# Patient Record
Sex: Male | Born: 1980 | Race: White | Hispanic: No | Marital: Married | Smoking: Never smoker
Health system: Southern US, Community
[De-identification: ages and names within clinical notes are randomized; demographics above are authoritative.]

## PROBLEM LIST (undated history)

## (undated) HISTORY — PX: BACK SURGERY: SHX140

## (undated) HISTORY — PX: ROUX-EN-Y GASTRIC BYPASS: SHX1104

---
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
## 2005-04-08 ENCOUNTER — Emergency Department (HOSPITAL_COMMUNITY): Admission: EM | Admit: 2005-04-08 | Discharge: 2005-04-08 | Payer: Self-pay | Admitting: *Deleted

## 2008-03-12 ENCOUNTER — Ambulatory Visit: Payer: Self-pay | Admitting: Family Medicine

## 2008-03-19 ENCOUNTER — Ambulatory Visit: Payer: Self-pay | Admitting: Family Medicine

## 2009-05-20 ENCOUNTER — Ambulatory Visit: Payer: Self-pay | Admitting: Family Medicine

## 2009-05-20 DIAGNOSIS — M549 Dorsalgia, unspecified: Secondary | ICD-10-CM | POA: Insufficient documentation

## 2009-05-20 DIAGNOSIS — Z872 Personal history of diseases of the skin and subcutaneous tissue: Secondary | ICD-10-CM | POA: Insufficient documentation

## 2009-05-20 DIAGNOSIS — F329 Major depressive disorder, single episode, unspecified: Secondary | ICD-10-CM

## 2009-07-30 ENCOUNTER — Ambulatory Visit: Payer: Self-pay | Admitting: Family Medicine

## 2009-09-16 ENCOUNTER — Ambulatory Visit: Payer: Self-pay | Admitting: Emergency Medicine

## 2009-09-16 DIAGNOSIS — M79609 Pain in unspecified limb: Secondary | ICD-10-CM

## 2009-09-21 ENCOUNTER — Telehealth (INDEPENDENT_AMBULATORY_CARE_PROVIDER_SITE_OTHER): Payer: Self-pay | Admitting: *Deleted

## 2010-03-08 NOTE — Assessment & Plan Note (Signed)
Summary: L ankle pain x today rm 4   Vital Signs:  Patient Profile:   30 Years Old Male CC:      L ankle pain x today Height:     68 inches Weight:      238 pounds O2 Sat:      100 % O2 treatment:    Room Air Temp:     97.8 degrees F oral Pulse rate:   88 / minute Pulse rhythm:   regular Resp:     16 per minute BP sitting:   115 / 83  (left arm) Cuff size:   regular  Vitals Entered By: Areta Haber CMA (September 16, 2009 6:52 PM)                  Current Allergies: ! REGLAN     History of Present Illness Chief Complaint: L ankle pain x today History of Present Illness: Left ankle pain today.  Wasn't doing anything, just standing around and it started hurting.  He has a remote history of a tibial fracture (the hardware was all removed per him).  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  Sharp pain on the back of his heel.  Able to walk.  No OTC's help and he cannot take NSAIDs due to stomach surgery.    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)   Current Meds BUPROPION HCL 100 MG TABS (BUPROPION HCL) Take 1 tablet by mouth three times a day CLARITIN 10 MG TABS (LORATADINE) as directed as needed MULTIVITAMINS  TABS (MULTIPLE VITAMIN) Take one tabelt by mouth once  aydya PRILOSEC 20 MG CPDR (OMEPRAZOLE) Take one tablet by mouth twice a day VITAMIN D3 1000 UNIT CAPS (CHOLECALCIFEROL) Take one tablet by mouth twice a day FERROUS SULFATE 325 (65 FE) MG TABS (FERROUS SULFATE) Take one tablet by mouth once a day TUMS 500 MG CHEW (CALCIUM CARBONATE ANTACID) Take one tablet by mouth three times a day TRAMADOL HCL 50 MG TABS (TRAMADOL HCL) Take 1 tablet by mouth three times a day as needed for back pain. ACTIGALL 300 MG CAPS (URSODIOL) Take 1 tab by mouth two times a day CARISOPRODOL 350 MG TABS (CARISOPRODOL) 1 tab by mouth at bedtime as needed  back pain VICODIN 5-500 MG TABS (HYDROCODONE-ACETAMINOPHEN) 1-2 tabs by mouth at bedtime as needed back pain VOLTAREN 1 % GEL (DICLOFENAC SODIUM) apply to affected area twice a day  REVIEW OF SYSTEMS Constitutional Symptoms      Denies fever, chills, night sweats, weight loss, weight gain, and fatigue.  Eyes       Denies change in vision, eye pain, eye discharge, glasses, contact lenses, and eye surgery. Ear/Nose/Throat/Mouth       Denies hearing loss/aids, change in hearing, ear pain, ear discharge, dizziness, frequent runny nose, frequent nose bleeds, sinus problems, sore throat, hoarseness, and tooth pain or bleeding.  Respiratory       Denies dry cough, productive cough, wheezing, shortness of breath, asthma, bronchitis, and emphysema/COPD.  Cardiovascular       Denies murmurs, chest pain, and tires easily with exhertion.    Gastrointestinal       Denies stomach pain, nausea/vomiting, diarrhea, constipation, blood in bowel movements, and indigestion. Genitourniary       Denies painful urination, kidney stones, and loss of urinary control. Neurological       Denies paralysis, seizures, and fainting/blackouts. Musculoskeletal  Complains of muscle pain, joint pain, joint stiffness, and decreased range of motion.      Denies redness, swelling, muscle weakness, and gout.      Comments: L ankle pain x today Skin       Denies bruising, unusual mles/lumps or sores, and hair/skin or nail changes.  Psych       Denies mood changes, temper/anger issues, anxiety/stress, speech problems, depression, and sleep problems. Other Comments: Pt states he has not done anything out of the normal for L ankle to hurt.   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  Never Smoked Alcohol use-yes, 3-4 times weekly Drug use-no Regular exercise-no 1 caffeinated drink per day.   Risk Factors: Alcohol Use: 0 (05/20/2009) Exercise: no (05/20/2009)  Risk Factors: Smoking Status: never (05/20/2009)  Past Medical History: Menier's Disease Chronic Back Pain.  Acid Reflux Depression Physical Exam General appearance: well developed, well nourished, no acute distress Extremities: Left foot/ankle: TTP over Achilles esp retrocancaneal bursa, Thompson test normal, no defect in Achilles felt, no other tenderness, mild swelling Neurological: grossly intact and non-focal Skin: no obvious rashes or lesions Assessment New Problems: HEEL PAIN, LEFT (ICD-729.5)  Retrocalcaneal bursitis Given a boot and he feels better when walking out the door  Patient Education: Patient and/or caregiver instructed in the following: rest.  Plan New Medications/Changes: VOLTAREN 1 % GEL (DICLOFENAC SODIUM) apply to affected area twice a day  #5 x 100g x 0, 09/16/2009, Hoyt Koch MD  New Orders: Est. Patient Level III [99213] Ambulatory Surgical Boot ea [L3260] Planning Comments:   Wear boot for the next week to allow your Achilles to rest Use the Voltaren gel as instructed Ice twice a day If not getting better, to follow up with your PCP or call Sports medicine downstairs to set up an appointment If not improving, may either need an Xray or Ultrasound or other workup   The patient and/or caregiver has been counseled thoroughly with regard to medications prescribed including dosage, schedule, interactions, rationale for use, and possible side effects and they verbalize understanding.  Diagnoses and expected course of recovery discussed and will return if not improved as expected or if the condition worsens.  Patient and/or caregiver verbalized understanding.  Prescriptions: VOLTAREN 1 % GEL (DICLOFENAC SODIUM) apply to affected area twice a day  #5 x 100g x 0   Entered and Authorized by:   Hoyt Koch MD   Signed by:   Hoyt Koch MD on 09/16/2009   Method used:   Printed then faxed to ...       [facility] [phone]* (retail)       [address] [place], [place]  [address]       Ph: [phone]       Fax: [phone]   RxID:   [id number]   Orders Added: 1)  Est. Patient Level III [24401] 2)  Ambulatory Surgical Boot ea [L3260]

## 2010-03-08 NOTE — Assessment & Plan Note (Signed)
Summary: LBP   Vital Signs:  Patient profile:   30 year old male Height:      68 inches Weight:      248 pounds BMI:     37.84 O2 Sat:      99 % on Room air Pulse rate:   84 / minute BP sitting:   124 / 78  (left arm) Cuff size:   large  Vitals Entered By: Payton Spark CMA (July 30, 2009 11:32 AM)  O2 Flow:  Room air CC: F/U back pain. Tramadol not working very well. , Back Pain   Primary Care Provider:  Nani Gasser, MD.   CC:  F/U back pain. Tramadol not working very well.  and Back Pain.  History of Present Illness:       This is a 30 year old man who presents with Back Pain.  Has back pain mostly on work days later in the afternoon.  Has pain just to the L of the lower back.  Denies radiation of pain into buttock or down the legs.  He gets some spasms.  He did PT but his insurance didn't cover it.  Chiropractor did not help a year ago.  Did accupuncture and E-Stim which helped temporarily.  Tramadol not helping.  Cannot take NSAIDs.  Had 3 LESIs. They did not help.  The patient denies weakness and urinary retention.  The pain is located in the left low back.  The pain is made worse by standing or walking and activity.  The pain is made better by inactivity and heat.    Current Medications (verified): 1)  Bupropion Hcl 100 Mg Tabs (Bupropion Hcl) .... Take 1 Tablet By Mouth Three Times A Day 2)  Claritin 10 Mg Tabs (Loratadine) .... As Directed As Needed 3)  Multivitamins  Tabs (Multiple Vitamin) .... Take One Tabelt By Mouth Once  Aydya 4)  Prilosec 20 Mg Cpdr (Omeprazole) .... Take One Tablet By Mouth Twice A Day 5)  Vitamin D3 1000 Unit Caps (Cholecalciferol) .... Take One Tablet By Mouth Twice A Day 6)  Ferrous Sulfate 325 (65 Fe) Mg Tabs (Ferrous Sulfate) .... Take One Tablet By Mouth Once A Day 7)  Tums 500 Mg Chew (Calcium Carbonate Antacid) .... Take One Tablet By Mouth Three Times A Day 8)  Tramadol Hcl 50 Mg Tabs (Tramadol Hcl) .... Take 1 Tablet By Mouth  Three Times A Day As Needed For Back Pain. 9)  Actigall 300 Mg Caps (Ursodiol) .... Take 1 Tab By Mouth Two Times A Day  Allergies (verified): 1)  ! Reglan  Past History:  Past Medical History: Reviewed history from 05/20/2009 and no changes required. Menier's Disease Chronic Back Pain.   Past Surgical History: Reviewed history from 05/20/2009 and no changes required. 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  Never Smoked Alcohol use-yes, 3-4 times weekly Drug use-no Regular exercise-no 1 caffeinated drink per day.   Review of Systems      See HPI  Physical Exam  General:  alert, well-developed, well-nourished, and well-hydrated.  obese (down from 330!) Lungs:  normal respiratory effort, no intercostal retractions, and no accessory muscle use.   Heart:  normal rate, regular rhythm, and no murmur.   Msk:  point tender over the L transverse process of  L5 with muscle fullness.  full active L spine ROM.  Neg seated straight leg raise Neurologic:  gait normal.     Impression & Recommendations:  Problem # 1:  BACK PAIN, CHRONIC (ICD-724.5) Hx of Lumbar bulding discs after lifting incident in 06 and morbid obesity, losing weight 6 mos after gastric bypass surgery.  Has tried accupuncture, chiropractor, flexeril and has to avoid NSAIDs after bypass.  Did not respond to Tramadol.  Symptoms / exam today c/w MSK LBP with lack of regular exercise.  Will do home PT - h/o given + add a walking program.  Use Thermacare heat wraps during the day and use Soma with Vicodin at night only if needed for pain.  F/U in 4 mos.  Keep up the wt loss --> this should also alleviate some of his back pain. His updated medication list for this problem includes:    Tramadol Hcl 50 Mg Tabs (Tramadol  hcl) .Marland Kitchen... Take 1 tablet by mouth three times a day as needed for back pain.    Carisoprodol 350 Mg Tabs (Carisoprodol) .Marland Kitchen... 1 tab by mouth at bedtime as needed back pain    Vicodin 5-500 Mg Tabs (Hydrocodone-acetaminophen) .Marland Kitchen... 1-2 tabs by mouth at bedtime as needed back pain  Complete Medication List: 1)  Bupropion Hcl 100 Mg Tabs (Bupropion hcl) .... Take 1 tablet by mouth three times a day 2)  Claritin 10 Mg Tabs (Loratadine) .... As directed as needed 3)  Multivitamins Tabs (Multiple vitamin) .... Take one tabelt by mouth once  aydya 4)  Prilosec 20 Mg Cpdr (Omeprazole) .... Take one tablet by mouth twice a day 5)  Vitamin D3 1000 Unit Caps (Cholecalciferol) .... Take one tablet by mouth twice a day 6)  Ferrous Sulfate 325 (65 Fe) Mg Tabs (Ferrous sulfate) .... Take one tablet by mouth once a day 7)  Tums 500 Mg Chew (Calcium carbonate antacid) .... Take one tablet by mouth three times a day 8)  Tramadol Hcl 50 Mg Tabs (Tramadol hcl) .... Take 1 tablet by mouth three times a day as needed for back pain. 9)  Actigall 300 Mg Caps (Ursodiol) .... Take 1 tab by mouth two times a day 10)  Carisoprodol 350 Mg Tabs (Carisoprodol) .Marland Kitchen.. 1 tab by mouth at bedtime as needed back pain 11)  Vicodin 5-500 Mg Tabs (Hydrocodone-acetaminophen) .Marland Kitchen.. 1-2 tabs by mouth at bedtime as needed back pain  Patient Instructions: 1)  Try Thermacare Heat Wraps during the day. 2)  Start home PT -- see low back stretching h/o. 3)  Start walking 20 min + each day.  Work up to 60 min most days/ wk. 4)  At night, you can use generic Soma (muscle relaxer) and Vicodin as needed. 5)  REturn for f/u LBP in 4 mos. Prescriptions: VICODIN 5-500 MG TABS (HYDROCODONE-ACETAMINOPHEN) 1-2 tabs by mouth at bedtime as needed back pain  #40 x 0   Entered and Authorized by:   Seymour Bars DO   Signed by:   Seymour Bars DO on 07/30/2009   Method used:   Printed then faxed to ...       [facility] [phone]* (retail)        [address] [place], [place]  [address]       Ph: [phone]       Fax: [phone]   RxID:   [id number] CARISOPRODOL 350 MG TABS (CARISOPRODOL) 1 tab by mouth at bedtime as needed  back pain  #30 x 1   Entered and Authorized by:   Seymour Bars DO   Signed by:   Seymour Bars DO on 07/30/2009   Method used:   Electronically to        [facility] [phone]* (retail)       [address] [place], [place]  [address]       Ph: [phone]       Fax: [phone]   RxID:   [id number] PRILOSEC 20 MG CPDR (OMEPRAZOLE) Take one tablet by mouth twice a day  #180 x 1   Entered and Authorized by:   Seymour Bars DO   Signed by:   Seymour Bars DO on 07/30/2009   Method used:   Faxed to ...       [facility] (mail-order)             , [place]         Ph: [phone]       Fax: [phone]   RxID:   [id number] BUPROPION HCL 100 MG TABS (BUPROPION HCL) Take 1 tablet by mouth three times a day  #270 x 3   Entered and Authorized by:   Seymour Bars DO   Signed by:   Seymour Bars DO on 07/30/2009   Method used:   Faxed to ...       [facility] (mail-order)             , [place]         Ph: [phone]       Fax: [phone]   RxID:   [id number]

## 2010-03-08 NOTE — Assessment & Plan Note (Signed)
Summary: NOV: BAck pain, mood   Vital Signs:  Patient profile:   30 year old male Height:      68 inches Weight:      262 pounds BMI:     39.98 Pulse rate:   80 / minute BP sitting:   126 / 80  (left arm) Cuff size:   regular  Vitals Entered By: Kathlene November (May 20, 2009 10:51 AM) CC: NP- get established- has hx of back pain Is Patient Diabetic? No   Primary Care Provider:  Nani Gasser, MD.   CC:  NP- get established- has hx of back pain.  History of Present Illness: NP- get established- has hx of back pain.  Hx of bulging disc. Had gastric bypass in December. Lost 60 punds since then. Back pain started in 2006 after moving furniture. About a year late started to flare up again.  Had an MRI at that time and that showed a mild bulging disc in the low back.  Used IBU 800mg  three times a day as needed .  Can't use Tyelnol currently post surgery.  HAs tried icey packs. Has tried accupuncture and chiropracter wtih no help.  Had 12 visits.  Has been given exercises.  Back has bothered him for the last 2 weeks. Some difficulty getting out of the car.  Last time saw ortho was in August. Had 3 steroid injection. First helped for about a month. 2nd helped for a week, and 3 rd only helped for a day. Was told to work on losing weight.   Habits & Providers  Alcohol-Tobacco-Diet     Alcohol drinks/day: 0     Tobacco Status: never  Exercise-Depression-Behavior     Does Patient Exercise: no     STD Risk: never     Drug Use: never     Seat Belt Use: always  Current Medications (verified): 1)  Wellbutrin Sr 150 Mg Xr12h-Tab (Bupropion Hcl) .Marland Kitchen.. 1 Tab By Mouth Two Times A Day 2)  Claritin 10 Mg Tabs (Loratadine) .... As Directed As Needed 3)  Multivitamins  Tabs (Multiple Vitamin) .... Take One Tabelt By Mouth Once  Aydya 4)  Prilosec 20 Mg Cpdr (Omeprazole) .... Take One Tablet By Mouth Twice A Day 5)  Vitamin D3 1000 Unit Caps (Cholecalciferol) .... Take One Tablet By Mouth Twice A  Day 6)  Ferrous Sulfate 325 (65 Fe) Mg Tabs (Ferrous Sulfate) .... Take One Tablet By Mouth Once A Day 7)  Tums 500 Mg Chew (Calcium Carbonate Antacid) .... Take One Tablet By Mouth Three Times A Day  Allergies (verified): 1)  ! Reglan  Past History:  Past Medical History: Menier's Disease Chronic Back Pain.   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  Never Smoked Alcohol use-yes, 3-4 times weekly Drug use-no Regular exercise-no 1 caffeinated drink per day. STD Risk:  never Drug Use:  never Seat Belt Use:  always  Review of Systems       No fever/sweats/weakness, unexplained weight loss/gain.  No vison changes.  + difficulty hearing/ringing in ears, + hay fever/allergies.  No chest pain/discomfort, palpitations.  No Br lump/nipple discharge.  No cough/wheeze.  No blood in BM, nausea/vomiting/diarrhea.  No nighttime urination, leaking urine, unusual vaginal bleeding, discharge (penis or vagina).  + muscle/joint pain. No rash, change in mole.  No HA, memory loss.  No anxiety, + sleep d/o, no depression.  No easy bruising/bleeding, unexplained lump   Physical Exam  General:  Well-developed,well-nourished,in no acute distress; alert,appropriate and cooperative throughout examination Head:  Normocephalic and atraumatic without obvious abnormalities. No apparent alopecia or balding. Msk:  Normal flexion/extension, roatation right and left and side bending.  Nontender lumbar or thoraic spine. Nontender SI joints. Hip, knees,k and ankle strength 5/5 bila.   Neurologic:  Patellar and achillles 2+ bilat.  Skin:  no rashes.   Psych:  Cognition and judgment appear intact. Alert and cooperative with  normal attention span and concentration. No apparent delusions, illusions, hallucinations   Impression & Recommendations:  Problem # 1:  BACK PAIN, CHRONIC (ICD-724.5) Assessment New Dsicussed options. Weight loss will help voerall reduce his number of flares but discussed need to look at the long term solution.  Discussed the importance of PT. Will refer. IN the meantime can use tramadol for acute pain relief. Warned of inc risk of seizure d/o. Call if not helping.  His updated medication list for this problem includes:    Tramadol Hcl 50 Mg Tabs (Tramadol hcl) .Marland Kitchen... Take 1 tablet by mouth three times a day as needed for back pain.  Orders: Physical Therapy Referral (PT)  Problem # 2:  DEPRESSION (ICD-311) Assessment: New Pt feels he is not absorbing the wellbutrin well in the SR version since hsi surgery. Feels his mood is down a little. Will change to the IR which is three times a day. If this is not improving his mood over the next 2-3 weeks then will consider changing to an SSRI.   His updated medication list for this problem includes:    Bupropion Hcl 100 Mg Tabs (Bupropion hcl) .Marland Kitchen... Take 1 tablet by mouth three times a day  Complete Medication List: 1)  Bupropion Hcl 100 Mg Tabs (Bupropion hcl) .... Take 1 tablet by mouth three times a day 2)  Claritin 10 Mg Tabs (Loratadine) .... As directed as needed 3)  Multivitamins Tabs (Multiple vitamin) .... Take one tabelt by mouth once  aydya 4)  Prilosec 20 Mg Cpdr (Omeprazole) .... Take one tablet by mouth twice a day 5)  Vitamin D3 1000 Unit Caps (Cholecalciferol) .... Take one tablet by mouth twice a day 6)  Ferrous Sulfate 325 (65 Fe) Mg Tabs (Ferrous sulfate) .... Take one tablet by mouth once a day 7)  Tums 500 Mg Chew (Calcium carbonate antacid) .... Take one tablet by mouth three times a day 8)  Tramadol Hcl 50 Mg Tabs (Tramadol hcl) .... Take 1 tablet by mouth three times a day as needed for back pain.  Patient  Instructions: 1)  Call it the wellbutrin is not helping.  2)  We will call you with the PT Friday.  Prescriptions: BUPROPION HCL 100 MG TABS (BUPROPION HCL) Take 1 tablet by mouth three times a day  #90 x 1   Entered and Authorized by:   Nani Gasser MD   Signed by:   Nani Gasser MD on 05/20/2009   Method used:   Electronically to        [facility] [phone]* (retail)       [address]       [place], [place]  [address]       Ph: [phone]  Fax: [phone]   RxID:   [id number] TRAMADOL HCL 50 MG TABS (TRAMADOL HCL) Take 1 tablet by mouth three times a day as needed for back pain.  #60 x 1   Entered and Authorized by:   Nani Gasser MD   Signed by:   Nani Gasser MD on 05/20/2009   Method used:   Electronically to        [facility] [phone]* (retail)       [address]       [place], [place]  [address]       Ph: [phone]       Fax: [phone]   RxID:   [id number]

## 2010-03-08 NOTE — Progress Notes (Signed)
  Phone Note Outgoing Call Call back at [facility] Phone [phone]   Call placed by: Lajean Saver RN,  September 21, 2009 10:47 AM Call placed to: Patient Summary of Call: patient call back placed to check on patients left ankle. I did not get an answer. I left a message daying to call back with any questions

## 2011-07-19 ENCOUNTER — Other Ambulatory Visit: Payer: Self-pay | Admitting: Orthopedic Surgery

## 2011-07-20 ENCOUNTER — Encounter (HOSPITAL_COMMUNITY): Payer: Self-pay | Admitting: Pharmacy Technician

## 2011-07-27 ENCOUNTER — Encounter (HOSPITAL_COMMUNITY): Admission: RE | Payer: Self-pay | Source: Ambulatory Visit

## 2011-07-27 ENCOUNTER — Ambulatory Visit (HOSPITAL_COMMUNITY)
Admission: RE | Admit: 2011-07-27 | Payer: BC Managed Care – PPO | Source: Ambulatory Visit | Admitting: Orthopedic Surgery

## 2011-07-27 SURGERY — POSTERIOR LUMBAR FUSION 1 LEVEL
Anesthesia: General | Laterality: Left

## 2011-09-15 ENCOUNTER — Emergency Department (HOSPITAL_COMMUNITY): Payer: BC Managed Care – PPO

## 2011-09-15 ENCOUNTER — Encounter (HOSPITAL_COMMUNITY): Payer: Self-pay | Admitting: Emergency Medicine

## 2011-09-15 ENCOUNTER — Emergency Department (HOSPITAL_COMMUNITY)
Admission: EM | Admit: 2011-09-15 | Discharge: 2011-09-15 | Disposition: A | Discharge status: Home / Self Care | Payer: BC Managed Care – PPO | Attending: General Surgery | Admitting: General Surgery

## 2011-09-15 DIAGNOSIS — Z79899 Other long term (current) drug therapy: Secondary | ICD-10-CM | POA: Insufficient documentation

## 2011-09-15 DIAGNOSIS — R109 Unspecified abdominal pain: Secondary | ICD-10-CM

## 2011-09-15 DIAGNOSIS — R52 Pain, unspecified: Secondary | ICD-10-CM | POA: Insufficient documentation

## 2011-09-15 DIAGNOSIS — R1013 Epigastric pain: Secondary | ICD-10-CM | POA: Insufficient documentation

## 2011-09-15 LAB — RAPID URINE DRUG SCREEN, HOSP PERFORMED
Amphetamines: NOT DETECTED
Benzodiazepines: NOT DETECTED
Opiates: POSITIVE — AB

## 2011-09-15 LAB — URINE MICROSCOPIC-ADD ON

## 2011-09-15 LAB — CBC WITH DIFFERENTIAL/PLATELET
Basophils Absolute: 0.1 10*3/uL (ref 0.0–0.1)
Eosinophils Absolute: 0.2 10*3/uL (ref 0.0–0.7)
Lymphocytes Relative: 10 % — ABNORMAL LOW (ref 12–46)
Lymphs Abs: 1.8 10*3/uL (ref 0.7–4.0)
MCHC: 34.3 g/dL (ref 30.0–36.0)
MCV: 92.8 fL (ref 78.0–100.0)
Neutro Abs: 13.4 10*3/uL — ABNORMAL HIGH (ref 1.7–7.7)
Neutrophils Relative %: 78 % — ABNORMAL HIGH (ref 43–77)
RDW: 12.3 % (ref 11.5–15.5)

## 2011-09-15 LAB — COMPREHENSIVE METABOLIC PANEL
ALT: 40 U/L (ref 0–53)
AST: 40 U/L — ABNORMAL HIGH (ref 0–37)
Albumin: 4.3 g/dL (ref 3.5–5.2)
BUN: 14 mg/dL (ref 6–23)
CO2: 29 mEq/L (ref 19–32)
Creatinine, Ser: 0.91 mg/dL (ref 0.50–1.35)
GFR calc Af Amer: >90 mL/min (ref >90)
Potassium: 3.9 mEq/L (ref 3.5–5.1)
Total Protein: 6.8 g/dL (ref 6.0–8.3)

## 2011-09-15 LAB — URINALYSIS, ROUTINE W REFLEX MICROSCOPIC
Ketones, ur: NEGATIVE mg/dL
Protein, ur: NEGATIVE mg/dL
Specific Gravity, Urine: 1.023 (ref 1.005–1.030)
pH: 7 (ref 5.0–8.0)

## 2011-09-15 LAB — ETHANOL: Alcohol, Ethyl (B): <11 mg/dL (ref 0–11)

## 2011-09-15 MED ORDER — SODIUM CHLORIDE 0.9 % IV SOLN
1000.0000 mL | INTRAVENOUS | Status: DC
  Administered 2011-09-15: 1000 mL via INTRAVENOUS

## 2011-09-15 MED ORDER — LORAZEPAM 2 MG/ML IJ SOLN
1.0000 mg | INTRAMUSCULAR | Status: DC | PRN
  Administered 2011-09-15: 1 mg via INTRAVENOUS
  Filled 2011-09-15: qty 1

## 2011-09-15 MED ORDER — SODIUM CHLORIDE 0.9 % IV SOLN
INTRAVENOUS | Status: DC
  Administered 2011-09-15: 11:00:00 via INTRAVENOUS

## 2011-09-15 MED ORDER — HYDROMORPHONE HCL PF 1 MG/ML IJ SOLN
1.0000 mg | INTRAMUSCULAR | Status: DC | PRN
  Administered 2011-09-15: 1 mg via INTRAVENOUS
  Filled 2011-09-15: qty 1

## 2011-09-15 MED ORDER — ONDANSETRON HCL 4 MG/2ML IJ SOLN
4.0000 mg | Freq: Once | INTRAMUSCULAR | Status: AC
  Administered 2011-09-15: 4 mg via INTRAVENOUS
  Filled 2011-09-15: qty 2

## 2011-09-15 MED ORDER — HYDROMORPHONE HCL PF 1 MG/ML IJ SOLN
1.0000 mg | Freq: Once | INTRAMUSCULAR | Status: AC
  Administered 2011-09-15: 1 mg via INTRAVENOUS
  Filled 2011-09-15: qty 1

## 2011-09-15 MED ORDER — SODIUM CHLORIDE 0.9 % IV BOLUS (SEPSIS)
1000.0000 mL | Freq: Once | INTRAVENOUS | Status: AC
  Administered 2011-09-15: 1000 mL via INTRAVENOUS

## 2011-09-15 MED ORDER — FENTANYL CITRATE 0.05 MG/ML IJ SOLN
50.0000 ug | Freq: Once | INTRAMUSCULAR | Status: AC
  Administered 2011-09-15: 50 ug via INTRAVENOUS
  Filled 2011-09-15: qty 2

## 2011-09-15 MED ORDER — LORAZEPAM 2 MG/ML IJ SOLN
1.0000 mg | Freq: Once | INTRAMUSCULAR | Status: AC
  Administered 2011-09-15: 1 mg via INTRAVENOUS
  Filled 2011-09-15: qty 1

## 2011-09-15 MED ORDER — IOHEXOL 300 MG/ML  SOLN
100.0000 mL | Freq: Once | INTRAMUSCULAR | Status: AC | PRN
  Administered 2011-09-15: 100 mL via INTRAVENOUS

## 2011-09-15 NOTE — ED Notes (Addendum)
Pt presenting to ed with c/o lower abdominal pain onset at 6:30 this morning. with positive nausea no vomiting. Pt states normal bowel movement yesterday. Pt diaphoretic at this time. Pt states pain is severe

## 2011-09-15 NOTE — ED Provider Notes (Signed)
Surgery team has seen pt, have offered pt admission for obs, but pt wants to go home.  Per surgery, will do clear liquids for 24 hours, f/u with his surgeon in [place] if pain continues  Rolan Bucco, MD 09/15/11 1713

## 2011-09-15 NOTE — ED Notes (Signed)
Pt back from CT

## 2011-09-15 NOTE — ED Notes (Signed)
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 Mother has intermittently put oxygen via nasal cannula on and off pt.

## 2011-09-15 NOTE — ED Provider Notes (Signed)
History     CSN: [id number]  Arrival date & time 09/15/11  0944   First MD Initiated Contact with Patient 09/15/11 1005      Chief Complaint  Patient presents with  . Abdominal Pain    (Consider location/radiation/quality/duration/timing/severity/associated sxs/prior treatment) HPI Comments: Dave FORRESTER is a 31 y.o. Male who was awakened. This morning with upper abdominal pain. Subsequently he had retching without vomiting. He denies any prodrome. He was well yesterday. He has never had this problem previously.   Level V Caveat- severe pain  Patient is a 31 y.o. male presenting with abdominal pain. The history is provided by the patient.  Abdominal Pain The primary symptoms of the illness include abdominal pain.    History reviewed. No pertinent past medical history.  Past Surgical History  Procedure Date  . Back surgery   . Roux-en-y gastric bypass     No family history on file.  History  Substance Use Topics  . Smoking status: Never Smoker   . Smokeless tobacco: Not on file  . Alcohol Use: Yes     occassionally      Review of Systems  Unable to perform ROS Gastrointestinal: Positive for abdominal pain.    Allergies  Metoclopramide hcl  Home Medications   Current Outpatient Rx  Name Route Sig Dispense Refill  . BUPROPION HCL 100 MG PO TABS Oral Take 100 mg by mouth 3 (three) times daily.     Marland Kitchen CALCIUM CARBONATE ANTACID 500 MG PO CHEW Oral Chew 1 tablet by mouth 3 (three) times daily.    Marland Kitchen VITAMIN D 1000 UNITS PO TABS Oral Take 4,000 Units by mouth daily.    Marland Kitchen FERROUS SULFATE 325 (65 FE) MG PO TABS Oral Take 325 mg by mouth 2 (two) times daily.    Marland Kitchen HYDROCODONE-ACETAMINOPHEN 10-325 MG PO TABS Oral Take 1 tablet by mouth every 6 (six) hours as needed. For pain    . LORATADINE 10 MG PO TABS Oral Take 10 mg by mouth 2 (two) times daily.    . MELOXICAM 15 MG PO TABS Oral Take 15 mg by mouth 2 (two) times daily.    Carma Leaven M PLUS PO TABS Oral Take 1  tablet by mouth 3 (three) times daily.    Marland Kitchen OMEPRAZOLE 20 MG PO CPDR Oral Take 20 mg by mouth 2 (two) times daily.      BP 129/75  Pulse 87  Temp 98.2 F (36.8 C) (Oral)  Resp 16  SpO2 99%  Physical Exam  Nursing note and vitals reviewed. Constitutional: He is oriented to person, place, and time. He appears well-developed and well-nourished. He appears distressed (moderate).  HENT:  Head: Normocephalic and atraumatic.  Right Ear: External ear normal.  Left Ear: External ear normal.  Eyes: Conjunctivae and EOM are normal. Pupils are equal, round, and reactive to light.  Neck: Normal range of motion and phonation normal. Neck supple.  Cardiovascular: Normal rate, regular rhythm, normal heart sounds and intact distal pulses.   Pulmonary/Chest: Effort normal. He exhibits no bony tenderness.       Tachyneic, gasping related to pain.  Abdominal: Soft. Normal appearance. He exhibits no distension and no mass. There is no tenderness. There is no rebound and no guarding.  Musculoskeletal: Normal range of motion.  Neurological: He is alert and oriented to person, place, and time. He has normal strength. No cranial nerve deficit or sensory deficit. He exhibits normal muscle tone. Coordination normal.  Skin: Skin is  warm and intact.       Diaphoretic  Psychiatric: He has a normal mood and affect. His behavior is normal. Judgment and thought content normal.    ED Course  Procedures (including critical care time)  Urgency, department treatment: IV fluid was entered. Multiple doses of Dilaudid, and Ativan, and Zofran  Serial reevaluations in the emergency department.   15:15- At this time the patient is sleeping, and was able to be aroused easily. He has minimal pain now. The abdomen remains nontender to palpation. Will consult general surgery to help with disposition.       Labs Reviewed  COMPREHENSIVE METABOLIC PANEL - Abnormal; Notable for the following:    Glucose, Bld 112 (*)       AST 40 (*)     All other components within normal limits  CBC WITH DIFFERENTIAL - Abnormal; Notable for the following:    WBC 17.1 (*)     Neutrophils Relative 78 (*)     Neutro Abs 13.4 (*)     Lymphocytes Relative 10 (*)     Monocytes Absolute 1.8 (*)     All other components within normal limits  URINALYSIS, ROUTINE W REFLEX MICROSCOPIC - Abnormal; Notable for the following:    Hgb urine dipstick SMALL (*)     All other components within normal limits  URINE RAPID DRUG SCREEN (HOSP PERFORMED) - Abnormal; Notable for the following:    Opiates POSITIVE (*)     All other components within normal limits  URINE MICROSCOPIC-ADD ON - Abnormal; Notable for the following:    Squamous Epithelial / LPF FEW (*)     Bacteria, UA FEW (*)     All other components within normal limits  LIPASE, BLOOD  ETHANOL  URINE CULTURE   Ct Abdomen Pelvis W Wo Contrast  09/15/2011  *RADIOLOGY REPORT*  Clinical Data: Mid upper abdominal pain.  Back pain.  Nausea and vomiting.  CT ABDOMEN AND PELVIS WITHOUT AND WITH CONTRAST  Technique:  Multidetector CT imaging of the abdomen and pelvis was performed without contrast material in one or both body regions, followed by contrast material(s) and further sections in one or both body regions.  Contrast: OMNIPAQUE IOHEXOL 300 MG/ML  SOLN  Comparison: 09/15/2011 radiographs  Findings: Noncontrast images demonstrate approximately 10 nonobstructive calculi in the each kidney, measuring up to 4 mm in long axis.  No hydronephrosis or hydroureter.  Postoperative findings noted in the stomach with patent gastrojejunostomy.  The liver, The liver, spleen, pancreas, and adrenal glands appear unremarkable.  The gallbladder and biliary system appear unremarkable.  No significant abnormal renal enhancement is observed.  Scattered mesenteric lymph nodes are present, somewhat eccentric to the left, and not pathologically enlarged but are increased in number and may be reactive.  The  ligament of Treitz is appropriately positioned, but there is prominence of contrast filled bowel loops in the left upper quadrant.  There is a mildly swirled appearance of mesenteric vasculature, for example on images 65-80 of series 10, eccentric to the left with some mild effacement of mesenteric venous structures. This is less striking than is typically encountered in the setting of volvulus.  The small left external iliac lymph nodes are not pathologically enlarged by size criteria.  A small amount of free pelvic fluid is present.  IMPRESSION:  1.  Mildly swirled appearance of the mesenteric vasculature in the left upper quadrant with prominence of small bowel loops of this vicinity.  Given the history surgery this  raises possibility of a paraduodenal internal hernia, without obstruction.  Admittedly the loops do not extend directly between the pancreas and the stomach, and there are no compelling findings of strangulation. 2.  Bilateral nonobstructive nephrolithiasis. 3.  Trace free pelvic fluid.  Original Report Authenticated By: Dellia Cloud, M.D.   Dg Chest Port 1 View  09/15/2011  *RADIOLOGY REPORT*  Clinical Data: Pain  PORTABLE CHEST - 1 VIEW  Comparison: None  Findings: The heart size and mediastinal contours are within normal limits.  Both lungs are clear.  The visualized skeletal structures are unremarkable.  IMPRESSION: No active cardiopulmonary abnormalities.  Original Report Authenticated By: Rosealee Albee, M.D.   Dg Abd 2 Views  09/15/2011  *RADIOLOGY REPORT*  Clinical Data: Nausea.  Dry heaves and epigastric pain.  ABDOMEN - 2 VIEW  Comparison: None.  Findings: The bowel gas pattern is nonobstructed.  There is a moderate stool burden identified throughout the colon and rectum. No dilated loops of small bowel or air-fluid levels.  IMPRESSION:  1.  Nonobstructive bowel gas pattern. 2.  Moderate stool burden within the colon which may reflect constipation.  Original Report  Authenticated By: Rosealee Albee, M.D.     1. Abdominal pain, acute       MDM  Abdominal pain, nonspecific, improved, with treatment. Patient with prior abdominal surgery at risk for internal hernia.       Flint Melter, MD 09/17/11 [phone]

## 2011-09-15 NOTE — Consult Note (Signed)
Sherrie George, PA Physician Assistant Cosign Needed Surgery H&P 09/15/2011 4:39 PM   Dave Reynolds is an 31 y.o. male.  Chief Complaint: Acute onset of abdominal pain  Referring: Dr. Effie Shy emergency room and [facility]  HPI: Patient is a 31 year old male who was in his normal state of health. He awoke this morning with acute abdominal pain, he tried ice, Norco, became progressively worse and he presented to the emergency room at [facility] in acute discomfort. It took approximately 5 hours for him to get reasonable relief, with a fair amount of narcotics. He has never had this discomfort before. After several hours in the ER he is fairly comfortable and says that the discomfort is 80-90% resolved. Workup on admission shows white count of 17,100. All his other labs are normal. Chest x-ray was normal, 2 view abdominal film is also normal. CT scan showed a mildly swirled appearance of the mesenteric vasculature in the left upper quadrant. With a history of Roux-en-Y surgery there was a concern for some form of paraduodenal internal hernia without obstruction. This is been reviewed by Dr. Derrell Lolling, radiology and Dr. Ezzard Standing. He currently has no acute findings suggestive of internal hernia, perforated ulcer, or cholecystitis.  History reviewed. No pertinent past medical history.    Past Surgical History    Procedure  Date    .  Back surgery     .  Roux-en-y gastric bypass      No family history on file. both parents living and in good health. One brother in good health.  Social History: reports that he has never smoked. He does not have any smokeless tobacco history on file. He reports that he drinks alcohol. He reports that he does not use illicit drugs. he is a Engineer, civil (consulting) for [employer]. He is currently going through a separation/divorce.  Allergies:    Allergies    Allergen  Reactions    .  Metoclopramide Hcl       REACTION: Levonne Spiller syndrome       Prior to  Admission medications    Medication  Sig  Start Date  End Date  Taking?  Authorizing Provider    buPROPion (WELLBUTRIN) 100 MG tablet  Take 100 mg by mouth 3 (three) times daily.    Yes  Historical Provider, MD    calcium carbonate (TUMS - DOSED IN MG ELEMENTAL CALCIUM) 500 MG chewable tablet  Chew 1 tablet by mouth 3 (three) times daily.    Yes  Historical Provider, MD    cholecalciferol (VITAMIN D) 1000 UNITS tablet  Take 4,000 Units by mouth daily.    Yes  Historical Provider, MD    ferrous sulfate 325 (65 FE) MG tablet  Take 325 mg by mouth 2 (two) times daily.    Yes  Historical Provider, MD    HYDROcodone-acetaminophen (NORCO) 10-325 MG per tablet  Take 1 tablet by mouth every 6 (six) hours as needed. For pain    Yes  Historical Provider, MD    loratadine (CLARITIN) 10 MG tablet  Take 10 mg by mouth 2 (two) times daily.    Yes  Historical Provider, MD    meloxicam (MOBIC) 15 MG tablet  Take 15 mg by mouth 2 (two) times daily.    Yes  Historical Provider, MD    Multiple Vitamins-Minerals (MULTIVITAMINS THER. W/MINERALS) TABS  Take 1 tablet by mouth 3 (three) times daily.    Yes  Historical Provider, MD  omeprazole (PRILOSEC) 20 MG capsule  Take 20 mg by mouth 2 (two) times daily.    Yes  Historical Provider, MD     (Not in a hospital admission)    Results for orders placed during the hospital encounter of 09/15/11 (from the past 48 hour(s))    COMPREHENSIVE METABOLIC PANEL Status: Abnormal     Collection Time     09/15/11 10:10 AM    Component  Value  Range  Comment     Sodium  138  135 - 145 mEq/L      Potassium  3.9  3.5 - 5.1 mEq/L      Chloride  100  96 - 112 mEq/L      CO2  29  19 - 32 mEq/L      Glucose, Bld  112 (*)  70 - 99 mg/dL      BUN  14  6 - 23 mg/dL      Creatinine, Ser  4.78  0.50 - 1.35 mg/dL      Calcium  9.5  8.4 - 10.5 mg/dL      Total Protein  6.8  6.0 - 8.3 g/dL      Albumin  4.3  3.5 - 5.2 g/dL      AST  40 (*)  0 - 37 U/L      ALT  40  0 - 53 U/L       Alkaline Phosphatase  52  39 - 117 U/L      Total Bilirubin  0.6  0.3 - 1.2 mg/dL      GFR calc non Af Amer  >90  >90 mL/min      GFR calc Af Amer  >90  >90 mL/min     CBC WITH DIFFERENTIAL Status: Abnormal     Collection Time     09/15/11 10:10 AM    Component  Value  Range  Comment     WBC  17.1 (*)  4.0 - 10.5 K/uL      RBC  4.84  4.22 - 5.81 MIL/uL      Hemoglobin  15.4  13.0 - 17.0 g/dL      HCT  29.5  62.1 - 52.0 %      MCV  92.8  78.0 - 100.0 fL      MCH  31.8  26.0 - 34.0 pg      MCHC  34.3  30.0 - 36.0 g/dL      RDW  30.8  65.7 - 15.5 %      Platelets  164  150 - 400 K/uL      Neutrophils Relative  78 (*)  43 - 77 %      Neutro Abs  13.4 (*)  1.7 - 7.7 K/uL      Lymphocytes Relative  10 (*)  12 - 46 %      Lymphs Abs  1.8  0.7 - 4.0 K/uL      Monocytes Relative  10  3 - 12 %      Monocytes Absolute  1.8 (*)  0.1 - 1.0 K/uL      Eosinophils Relative  1  0 - 5 %      Eosinophils Absolute  0.2  0.0 - 0.7 K/uL      Basophils Relative  0  0 - 1 %      Basophils Absolute  0.1  0.0 - 0.1 K/uL     LIPASE, BLOOD Status: Normal  Collection Time     09/15/11 10:10 AM    Component  Value  Range  Comment     Lipase  39  11 - 59 U/L     ETHANOL Status: Normal     Collection Time     09/15/11 10:10 AM    Component  Value  Range  Comment     Alcohol, Ethyl (B)  <11  0 - 11 mg/dL     URINALYSIS, ROUTINE W REFLEX MICROSCOPIC Status: Abnormal     Collection Time     09/15/11 10:24 AM    Component  Value  Range  Comment     Color, Urine  YELLOW  YELLOW      APPearance  CLEAR  CLEAR      Specific Gravity, Urine  1.023  1.005 - 1.030      pH  7.0  5.0 - 8.0      Glucose, UA  NEGATIVE  NEGATIVE mg/dL      Hgb urine dipstick  SMALL (*)  NEGATIVE      Bilirubin Urine  NEGATIVE  NEGATIVE      Ketones, ur  NEGATIVE  NEGATIVE mg/dL      Protein, ur  NEGATIVE  NEGATIVE mg/dL      Urobilinogen, UA  1.0  0.0 - 1.0 mg/dL      Nitrite  NEGATIVE  NEGATIVE      Leukocytes, UA  NEGATIVE  NEGATIVE      URINE RAPID DRUG SCREEN (HOSP PERFORMED) Status: Abnormal     Collection Time     09/15/11 10:24 AM    Component  Value  Range  Comment     Opiates  POSITIVE (*)  NONE DETECTED      Cocaine  NONE DETECTED  NONE DETECTED      Benzodiazepines  NONE DETECTED  NONE DETECTED      Amphetamines  NONE DETECTED  NONE DETECTED      Tetrahydrocannabinol  NONE DETECTED  NONE DETECTED      Barbiturates  NONE DETECTED  NONE DETECTED     URINE MICROSCOPIC-ADD ON Status: Abnormal     Collection Time     09/15/11 10:24 AM    Component  Value  Range  Comment     Squamous Epithelial / LPF  FEW (*)  RARE      RBC / HPF  7-10  <3 RBC/hpf      Bacteria, UA  FEW (*)  RARE      Ct Abdomen Pelvis W Wo Contrast  09/15/2011 *RADIOLOGY REPORT* Clinical Data: Mid upper abdominal pain. Back pain. Nausea and vomiting. CT ABDOMEN AND PELVIS WITHOUT AND WITH CONTRAST Technique: Multidetector CT imaging of the abdomen and pelvis was performed without contrast material in one or both body regions, followed by contrast material(s) and further sections in one or both body regions. Contrast: OMNIPAQUE IOHEXOL 300 MG/ML SOLN Comparison: 09/15/2011 radiographs Findings: Noncontrast images demonstrate approximately 10 nonobstructive calculi in the each kidney, measuring up to 4 mm in long axis. No hydronephrosis or hydroureter. Postoperative findings noted in the stomach with patent gastrojejunostomy. The liver, The liver, spleen, pancreas, and adrenal glands appear unremarkable. The gallbladder and biliary system appear unremarkable. No significant abnormal renal enhancement is observed. Scattered mesenteric lymph nodes are present, somewhat eccentric to the left, and not pathologically enlarged but are increased in number and may be reactive. The ligament of Treitz is appropriately positioned, but there is prominence of contrast filled  bowel loops in the left upper quadrant. There is a mildly swirled appearance of mesenteric  vasculature, for example on images 65-80 of series 10, eccentric to the left with some mild effacement of mesenteric venous structures. This is less striking than is typically encountered in the setting of volvulus. The small left external iliac lymph nodes are not pathologically enlarged by size criteria. A small amount of free pelvic fluid is present. IMPRESSION: 1. Mildly swirled appearance of the mesenteric vasculature in the left upper quadrant with prominence of small bowel loops of this vicinity. Given the history surgery this raises possibility of a paraduodenal internal hernia, without obstruction. Admittedly the loops do not extend directly between the pancreas and the stomach, and there are no compelling findings of strangulation. 2. Bilateral nonobstructive nephrolithiasis. 3. Trace free pelvic fluid. Original Report Authenticated By: Dellia Cloud, M.D.  Dg Chest Port 1 View  09/15/2011 *RADIOLOGY REPORT* Clinical Data: Pain PORTABLE CHEST - 1 VIEW Comparison: None Findings: The heart size and mediastinal contours are within normal limits. Both lungs are clear. The visualized skeletal structures are unremarkable. IMPRESSION: No active cardiopulmonary abnormalities. Original Report Authenticated By: Rosealee Albee, M.D.  Dg Abd 2 Views  09/15/2011 *RADIOLOGY REPORT* Clinical Data: Nausea. Dry heaves and epigastric pain. ABDOMEN - 2 VIEW Comparison: None. Findings: The bowel gas pattern is nonobstructed. There is a moderate stool burden identified throughout the colon and rectum. No dilated loops of small bowel or air-fluid levels. IMPRESSION: 1. Nonobstructive bowel gas pattern. 2. Moderate stool burden within the colon which may reflect constipation. Original Report Authenticated By: Rosealee Albee, M.D.   Review of Systems  Constitutional: Negative.  HENT: Negative.  Eyes: Negative.  Respiratory: Negative.  Cardiovascular: Negative.  Gastrointestinal: Positive for nausea and  abdominal pain (acute onset with no real relief for 5 hours.). Negative for diarrhea, constipation, blood in stool and melena.  Genitourinary: Negative.  Musculoskeletal: Positive for back pain (has some chronic pain, mutliple treatments, now using a trainer.).  Skin: Negative.  Neurological: Negative.  Endo/Heme/Allergies: Negative.  Psychiatric/Behavioral: Positive for depression.   Blood pressure 120/65, pulse 76, temperature 97.8 F (36.6 C), temperature source Oral, resp. rate 18, SpO2 99.00%.  Physical Exam  Constitutional: He is oriented to person, place, and time. He appears well-developed and well-nourished. No distress.  HENT:  Head: Normocephalic and atraumatic.  Nose: Nose normal.  Eyes: Conjunctivae and EOM are normal. Pupils are equal, round, and reactive to light. Right eye exhibits no discharge. Left eye exhibits no discharge. No scleral icterus.  Neck: Normal range of motion. Neck supple. No JVD present. No tracheal deviation present. No thyromegaly present.  Cardiovascular: Normal rate, regular rhythm, normal heart sounds and intact distal pulses. Exam reveals no gallop.  No murmur heard.  Respiratory: Effort normal and breath sounds normal. No stridor. No respiratory distress. He has no wheezes. He has no rales. He exhibits no tenderness.  GI: Soft. Bowel sounds are normal. He exhibits no distension and no mass. There is no tenderness. There is no rebound and no guarding.  Some stretch marks from before Roux en Y  Musculoskeletal: Normal range of motion. He exhibits no edema and no tenderness.  Lymphadenopathy:  He has no cervical adenopathy.  Neurological: He is alert and oriented to person, place, and time. He has normal reflexes. No cranial nerve deficit. Coordination normal.  Skin: Skin is warm and dry. No rash noted. He is not diaphoretic. No erythema. No pallor.  Psychiatric: He has  a normal mood and affect. His behavior is normal. Judgment and thought content  normal.   Assessment/Plan  1.Acute abdominal pain of uncertain etiology. This is the first episode of acute abdominal pain since his Roux-en-Y surgery.  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 Adolphus Birchwood in [facility] 2.5 years ago. 3. History of depression.  4. Chronic back pain.  5. History of Stevens-Johnson syndrome with Reglan.  Plan: He was seen and evaluated by Dr. Derrell Lolling who offered him observation overnight. Patient prefers to go home. He is to remain on liquids for the next 24 hours. If he has recurrent pain he is to contact Dr. Adolphus Birchwood in [facility] who did his original Roux-en-Y surgery for acute followup at that time.  [facility] physician assistant for Dr. Claud Kelp.  Dave Reynolds  09/15/2011, 4:39 PM

## 2011-09-15 NOTE — ED Notes (Signed)
Pt given urinal at this time, and UA requested. Explained importance of obtaining UA asap and pt verbalized understanding. Told too notify staff upon completion, if he completed before staff returned.

## 2011-09-15 NOTE — ED Notes (Signed)
Pt alert and oriented x4. Respirations even and unlabored, bilateral symmetrical rise and fall of chest. Skin warm and dry. In no acute distress. Denies needs.   

## 2011-09-15 NOTE — Consult Note (Signed)
I have personally interviewed and examined this patient. I agree with the assessment and treatment plan by Mr. Marlyne Beards.  This is the first episode of abdominal pain the patient has had since his bypass surgery. He takes proton pump inhibiters twice daily at home. He is followed by Dr. Adolphus Birchwood in [facility] who did his bariatric surgery.  He had acute onset of upper bowel pain this morning which is now almost completely resolved. He is hungry and asking for food. She's had no change in his bowel habits. No fever or chills. He does have leukocytosis which is not well explained. His physical exam is unremarkable and his CT scan is nondiagnostic. Specifically I do not see any evidence of perforated marginal ulcer, acute cholecystitis, or internal hernia. There's no obstruction or bowel wall thickening or edema.  I've discussed this patient's care and reviewed the CT scan with Dr. Ovidio Kin, one of our bariatric surgeons.  Assessment: Self-limited upper abdominal pain of uncertain etiology History Roux-en-Y gastric bypass Unremarkable CT scan of abdomen with contrast. Leukocytosis of uncertain significance There is no indication for surgical intervention at this point in time.  Plan: The patient was given the option of observation in the hospital overnight and restarting a liquid diet. Also given the option of going home on liquid diet for 24 hours and advancing back to a bariatric diet as tolerated. Once home he was advised to call his bariatric surgeon should he develop any recurrent abdominal pain  It was his strong desire to go home. I spent a long time  counseling him regarding symptoms, differential diagnosis and management plans.   Angelia Mould. Derrell Lolling, M.D., [facility] General and Minimally invasive Surgery Breast and Colorectal Surgery Office:   [phone] Pager:   [phone]

## 2011-09-15 NOTE — H&P (Deleted)
Dave Reynolds is an 31 y.o. male.   Chief Complaint: Acute onset of abdominal pain Referring: Dr. Effie Shy emergency room and [facility] HPI: Patient is a 31 year old male who was in his normal state of health. He awoke this morning with acute abdominal pain, he tried ice, Norco, became progressively worse and he presented to the emergency room at [facility] in acute discomfort. It took approximately 5 hours for him to get reasonable relief, with a fair amount of narcotics. He has never had this discomfort before. After several hours in the ER he is fairly comfortable and says that the discomfort is 80-90% resolved. Workup on admission shows white count of 17,100. All his other labs are normal. Chest x-ray was normal, 2 view abdominal film is also normal. CT scan showed a mildly swirled appearance of the mesenteric vasculature in the left upper quadrant. With a history of Roux-en-Y surgery there was a concern for some form of paraduodenal internal hernia without obstruction. This is been reviewed by Dr. Derrell Lolling, radiology and Dr. Ezzard Standing. He currently has no acute findings suggestive of internal hernia, perforated ulcer, or cholecystitis.  History reviewed. No pertinent past medical history.  Past Surgical History  Procedure Date  . Back surgery   . Roux-en-y gastric bypass     No family history on file. both parents living and in good health. One brother in good health. Social History:  reports that he has never smoked. He does not have any smokeless tobacco history on file. He reports that he drinks alcohol. He reports that he does not use illicit drugs. he is a Engineer, civil (consulting) for [employer]. He is currently going through a separation/divorce.  Allergies:  Allergies  Allergen Reactions  . Metoclopramide Hcl     REACTION: Levonne Spiller syndrome   Prior to Admission medications   Medication Sig Start Date End Date Taking? Authorizing Provider  buPROPion (WELLBUTRIN) 100 MG  tablet Take 100 mg by mouth 3 (three) times daily.    Yes Historical Provider, MD  calcium carbonate (TUMS - DOSED IN MG ELEMENTAL CALCIUM) 500 MG chewable tablet Chew 1 tablet by mouth 3 (three) times daily.   Yes Historical Provider, MD  cholecalciferol (VITAMIN D) 1000 UNITS tablet Take 4,000 Units by mouth daily.   Yes Historical Provider, MD  ferrous sulfate 325 (65 FE) MG tablet Take 325 mg by mouth 2 (two) times daily.   Yes Historical Provider, MD  HYDROcodone-acetaminophen (NORCO) 10-325 MG per tablet Take 1 tablet by mouth every 6 (six) hours as needed. For pain   Yes Historical Provider, MD  loratadine (CLARITIN) 10 MG tablet Take 10 mg by mouth 2 (two) times daily.   Yes Historical Provider, MD  meloxicam (MOBIC) 15 MG tablet Take 15 mg by mouth 2 (two) times daily.   Yes Historical Provider, MD  Multiple Vitamins-Minerals (MULTIVITAMINS THER. W/MINERALS) TABS Take 1 tablet by mouth 3 (three) times daily.   Yes Historical Provider, MD  omeprazole (PRILOSEC) 20 MG capsule Take 20 mg by mouth 2 (two) times daily.   Yes Historical Provider, MD    (Not in a hospital admission)  Results for orders placed during the hospital encounter of 09/15/11 (from the past 48 hour(s))  COMPREHENSIVE METABOLIC PANEL     Status: Abnormal   Collection Time   09/15/11 10:10 AM      Component Value Range Comment   Sodium 138  135 - 145 mEq/L    Potassium 3.9  3.5 -  5.1 mEq/L    Chloride 100  96 - 112 mEq/L    CO2 29  19 - 32 mEq/L    Glucose, Bld 112 (*) 70 - 99 mg/dL    BUN 14  6 - 23 mg/dL    Creatinine, Ser 1.61  0.50 - 1.35 mg/dL    Calcium 9.5  8.4 - 09.6 mg/dL    Total Protein 6.8  6.0 - 8.3 g/dL    Albumin 4.3  3.5 - 5.2 g/dL    AST 40 (*) 0 - 37 U/L    ALT 40  0 - 53 U/L    Alkaline Phosphatase 52  39 - 117 U/L    Total Bilirubin 0.6  0.3 - 1.2 mg/dL    GFR calc non Af Amer >90  >90 mL/min    GFR calc Af Amer >90  >90 mL/min   CBC WITH DIFFERENTIAL     Status: Abnormal   Collection Time    09/15/11 10:10 AM      Component Value Range Comment   WBC 17.1 (*) 4.0 - 10.5 K/uL    RBC 4.84  4.22 - 5.81 MIL/uL    Hemoglobin 15.4  13.0 - 17.0 g/dL    HCT 04.5  40.9 - 81.1 %    MCV 92.8  78.0 - 100.0 fL    MCH 31.8  26.0 - 34.0 pg    MCHC 34.3  30.0 - 36.0 g/dL    RDW 91.4  78.2 - 95.6 %    Platelets 164  150 - 400 K/uL    Neutrophils Relative 78 (*) 43 - 77 %    Neutro Abs 13.4 (*) 1.7 - 7.7 K/uL    Lymphocytes Relative 10 (*) 12 - 46 %    Lymphs Abs 1.8  0.7 - 4.0 K/uL    Monocytes Relative 10  3 - 12 %    Monocytes Absolute 1.8 (*) 0.1 - 1.0 K/uL    Eosinophils Relative 1  0 - 5 %    Eosinophils Absolute 0.2  0.0 - 0.7 K/uL    Basophils Relative 0  0 - 1 %    Basophils Absolute 0.1  0.0 - 0.1 K/uL   LIPASE, BLOOD     Status: Normal   Collection Time   09/15/11 10:10 AM      Component Value Range Comment   Lipase 39  11 - 59 U/L   ETHANOL     Status: Normal   Collection Time   09/15/11 10:10 AM      Component Value Range Comment   Alcohol, Ethyl (B) <11  0 - 11 mg/dL   URINALYSIS, ROUTINE W REFLEX MICROSCOPIC     Status: Abnormal   Collection Time   09/15/11 10:24 AM      Component Value Range Comment   Color, Urine YELLOW  YELLOW    APPearance CLEAR  CLEAR    Specific Gravity, Urine 1.023  1.005 - 1.030    pH 7.0  5.0 - 8.0    Glucose, UA NEGATIVE  NEGATIVE mg/dL    Hgb urine dipstick SMALL (*) NEGATIVE    Bilirubin Urine NEGATIVE  NEGATIVE    Ketones, ur NEGATIVE  NEGATIVE mg/dL    Protein, ur NEGATIVE  NEGATIVE mg/dL    Urobilinogen, UA 1.0  0.0 - 1.0 mg/dL    Nitrite NEGATIVE  NEGATIVE    Leukocytes, UA NEGATIVE  NEGATIVE   URINE RAPID DRUG SCREEN (HOSP PERFORMED)  Status: Abnormal   Collection Time   09/15/11 10:24 AM      Component Value Range Comment   Opiates POSITIVE (*) NONE DETECTED    Cocaine NONE DETECTED  NONE DETECTED    Benzodiazepines NONE DETECTED  NONE DETECTED    Amphetamines NONE DETECTED  NONE DETECTED    Tetrahydrocannabinol NONE  DETECTED  NONE DETECTED    Barbiturates NONE DETECTED  NONE DETECTED   URINE MICROSCOPIC-ADD ON     Status: Abnormal   Collection Time   09/15/11 10:24 AM      Component Value Range Comment   Squamous Epithelial / LPF FEW (*) RARE    RBC / HPF 7-10  <3 RBC/hpf    Bacteria, UA FEW (*) RARE    Ct Abdomen Pelvis W Wo Contrast  09/15/2011  *RADIOLOGY REPORT*  Clinical Data: Mid upper abdominal pain.  Back pain.  Nausea and vomiting.  CT ABDOMEN AND PELVIS WITHOUT AND WITH CONTRAST  Technique:  Multidetector CT imaging of the abdomen and pelvis was performed without contrast material in one or both body regions, followed by contrast material(s) and further sections in one or both body regions.  Contrast: OMNIPAQUE IOHEXOL 300 MG/ML  SOLN  Comparison: 09/15/2011 radiographs  Findings: Noncontrast images demonstrate approximately 10 nonobstructive calculi in the each kidney, measuring up to 4 mm in long axis.  No hydronephrosis or hydroureter.  Postoperative findings noted in the stomach with patent gastrojejunostomy.  The liver, The liver, spleen, pancreas, and adrenal glands appear unremarkable.  The gallbladder and biliary system appear unremarkable.  No significant abnormal renal enhancement is observed.  Scattered mesenteric lymph nodes are present, somewhat eccentric to the left, and not pathologically enlarged but are increased in number and may be reactive.  The ligament of Treitz is appropriately positioned, but there is prominence of contrast filled bowel loops in the left upper quadrant.  There is a mildly swirled appearance of mesenteric vasculature, for example on images 65-80 of series 10, eccentric to the left with some mild effacement of mesenteric venous structures. This is less striking than is typically encountered in the setting of volvulus.  The small left external iliac lymph nodes are not pathologically enlarged by size criteria.  A small amount of free pelvic fluid is present.   IMPRESSION:  1.  Mildly swirled appearance of the mesenteric vasculature in the left upper quadrant with prominence of small bowel loops of this vicinity.  Given the history surgery this raises possibility of a paraduodenal internal hernia, without obstruction.  Admittedly the loops do not extend directly between the pancreas and the stomach, and there are no compelling findings of strangulation. 2.  Bilateral nonobstructive nephrolithiasis. 3.  Trace free pelvic fluid.  Original Report Authenticated By: Dellia Cloud, M.D.   Dg Chest Port 1 View  09/15/2011  *RADIOLOGY REPORT*  Clinical Data: Pain  PORTABLE CHEST - 1 VIEW  Comparison: None  Findings: The heart size and mediastinal contours are within normal limits.  Both lungs are clear.  The visualized skeletal structures are unremarkable.  IMPRESSION: No active cardiopulmonary abnormalities.  Original Report Authenticated By: Rosealee Albee, M.D.   Dg Abd 2 Views  09/15/2011  *RADIOLOGY REPORT*  Clinical Data: Nausea.  Dry heaves and epigastric pain.  ABDOMEN - 2 VIEW  Comparison: None.  Findings: The bowel gas pattern is nonobstructed.  There is a moderate stool burden identified throughout the colon and rectum. No dilated loops of small bowel or air-fluid levels.  IMPRESSION:  1.  Nonobstructive bowel gas pattern. 2.  Moderate stool burden within the colon which may reflect constipation.  Original Report Authenticated By: Rosealee Albee, M.D.    Review of Systems  Constitutional: Negative.   HENT: Negative.   Eyes: Negative.   Respiratory: Negative.   Cardiovascular: Negative.   Gastrointestinal: Positive for nausea and abdominal pain (acute onset with no real relief for 5 hours.). Negative for diarrhea, constipation, blood in stool and melena.  Genitourinary: Negative.   Musculoskeletal: Positive for back pain (has some chronic pain, mutliple treatments, now using a trainer.).  Skin: Negative.   Neurological: Negative.     Endo/Heme/Allergies: Negative.   Psychiatric/Behavioral: Positive for depression.    Blood pressure 120/65, pulse 76, temperature 97.8 F (36.6 C), temperature source Oral, resp. rate 18, SpO2 99.00%. Physical Exam  Constitutional: He is oriented to person, place, and time. He appears well-developed and well-nourished. No distress.  HENT:  Head: Normocephalic and atraumatic.  Nose: Nose normal.  Eyes: Conjunctivae and EOM are normal. Pupils are equal, round, and reactive to light. Right eye exhibits no discharge. Left eye exhibits no discharge. No scleral icterus.  Neck: Normal range of motion. Neck supple. No JVD present. No tracheal deviation present. No thyromegaly present.  Cardiovascular: Normal rate, regular rhythm, normal heart sounds and intact distal pulses.  Exam reveals no gallop.   No murmur heard. Respiratory: Effort normal and breath sounds normal. No stridor. No respiratory distress. He has no wheezes. He has no rales. He exhibits no tenderness.  GI: Soft. Bowel sounds are normal. He exhibits no distension and no mass. There is no tenderness. There is no rebound and no guarding.       Some stretch marks from before Roux en Y  Musculoskeletal: Normal range of motion. He exhibits no edema and no tenderness.  Lymphadenopathy:    He has no cervical adenopathy.  Neurological: He is alert and oriented to person, place, and time. He has normal reflexes. No cranial nerve deficit. Coordination normal.  Skin: Skin is warm and dry. No rash noted. He is not diaphoretic. No erythema. No pallor.  Psychiatric: He has a normal mood and affect. His behavior is normal. Judgment and thought content normal.     Assessment/Plan 1.Acute abdominal pain of uncertain etiology. This is the first episode of acute abdominal pain since his Roux-en-Y surgery. 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 3. History of depression. 4. Chronic back pain. 5. History of Stevens-Johnson  syndrome with Reglan.  Plan: He was seen and evaluated by Dr. Derrell Lolling who offered him observation overnight. Patient prefers to go home. He is to remain on liquids for the next 24 hours. If he has recurrent pain he is contact Dr. Adolphus Birchwood and [facility] who did his original Roux-en-Y surgery for acute followup at that time. [facility] physician assistant for Dr. Claud Kelp.  Laderrick Wilk 09/15/2011, 4:39 PM

## 2011-09-17 LAB — URINE CULTURE: Colony Count: 2000

## 2011-12-02 ENCOUNTER — Observation Stay: Payer: Self-pay | Admitting: Surgery

## 2011-12-02 LAB — CBC WITH DIFFERENTIAL/PLATELET
Basophil %: 0.4 %
Eosinophil #: 0.2 10*3/uL (ref 0.0–0.7)
Eosinophil %: 0.8 %
Lymphocyte #: 0.7 10*3/uL — ABNORMAL LOW (ref 1.0–3.6)
MCH: 31.2 pg (ref 26.0–34.0)
MCV: 92 fL (ref 80–100)
Monocyte #: 1.4 x10 3/mm — ABNORMAL HIGH (ref 0.2–1.0)
Monocyte %: 6.2 %
Neutrophil #: 20.6 10*3/uL — ABNORMAL HIGH (ref 1.4–6.5)
Platelet: 200 10*3/uL (ref 150–440)
WBC: 23 10*3/uL — ABNORMAL HIGH (ref 3.8–10.6)

## 2011-12-02 LAB — COMPREHENSIVE METABOLIC PANEL
Anion Gap: 11 (ref 7–16)
BUN: 13 mg/dL (ref 7–18)
Co2: 27 mmol/L (ref 21–32)
Creatinine: 1.15 mg/dL (ref 0.60–1.30)
EGFR (African American): >60
EGFR (Non-African Amer.): >60
Glucose: 121 mg/dL — ABNORMAL HIGH (ref 65–99)
SGOT(AST): 24 U/L (ref 15–37)
Sodium: 140 mmol/L (ref 136–145)
Total Protein: 7.7 g/dL (ref 6.4–8.2)

## 2011-12-02 LAB — URINALYSIS, COMPLETE
Glucose,UR: NEGATIVE mg/dL (ref 0–75)
Nitrite: NEGATIVE
Protein: 30
Specific Gravity: 1.048 (ref 1.003–1.030)

## 2013-08-10 IMAGING — CT CT ABDOMEN AND PELVIS WITHOUT AND WITH CONTRAST
1 of 3 series · 12 of 32 positions shown, 18 images · non-contrast
Comparison: none

REASON FOR EXAM: (1) severe abd pain; (2) severe abd pain
COMMENTS:

PROCEDURE:     CT  - CT ABDOMEN / PELVIS  W/WO  - December 02, 2011 [DATE]
RESULT:     CT abdomen and pelvis dated 11/26/1911.
TECHNIQUE: Helical helical 3 mm sections were obtained from the lung bases
through the pubic symphysis pre and status post intravenous administration
of 100 mL of Psovue-KJ1.

[Series 4: with · axial · 0.77mm/px · z∈[-1201,-769]mm · 12 of 172 slices shown, 18 images]
[im 14/172  soft-tissue]
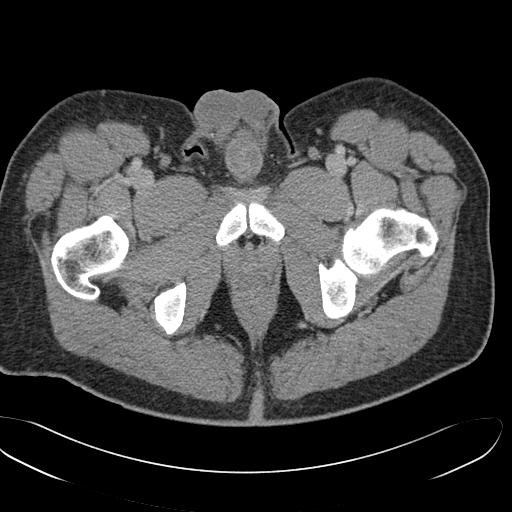
[im 14/172  bone]
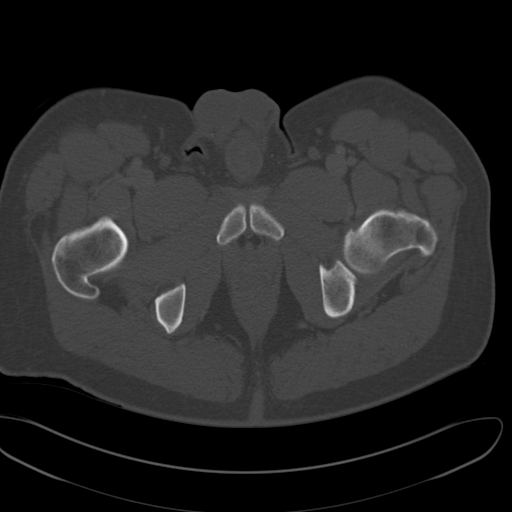
[im 27/172  soft-tissue]
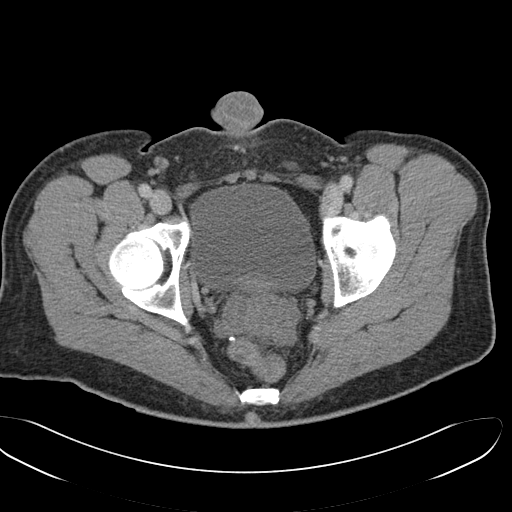
[im 40/172  soft-tissue]
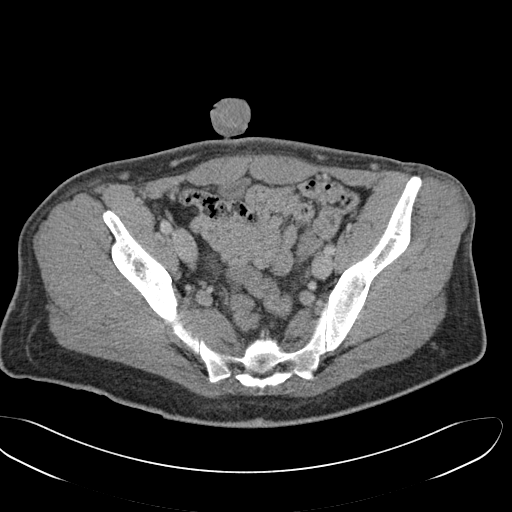
[im 53/172  soft-tissue]
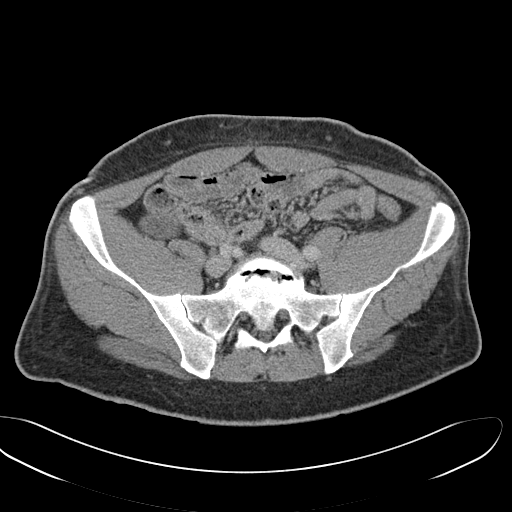
[im 66/172  soft-tissue]
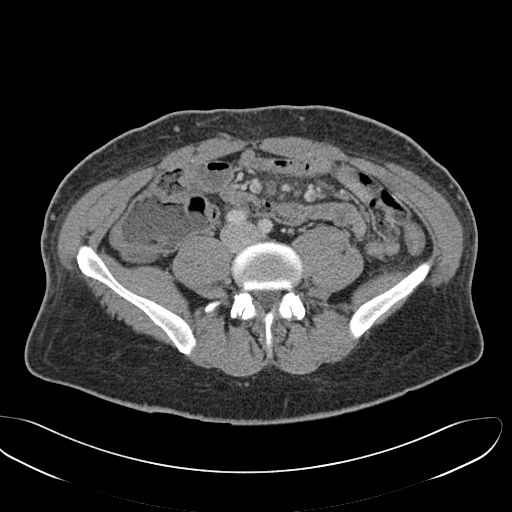
[im 79/172  soft-tissue]
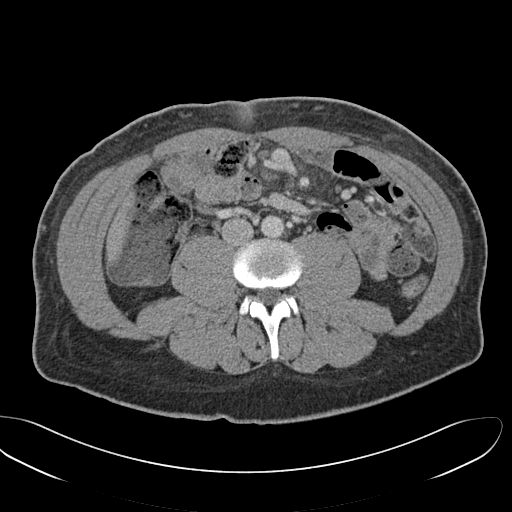
[im 93/172  soft-tissue]
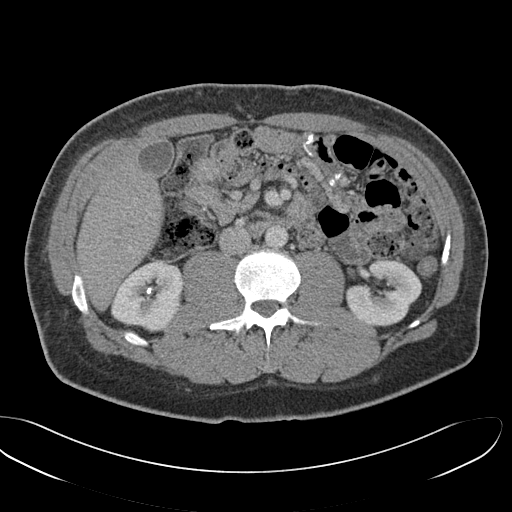
[im 106/172  soft-tissue]
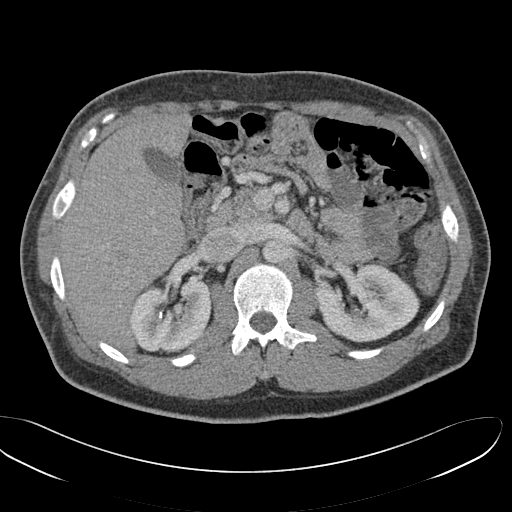
[im 119/172  soft-tissue]
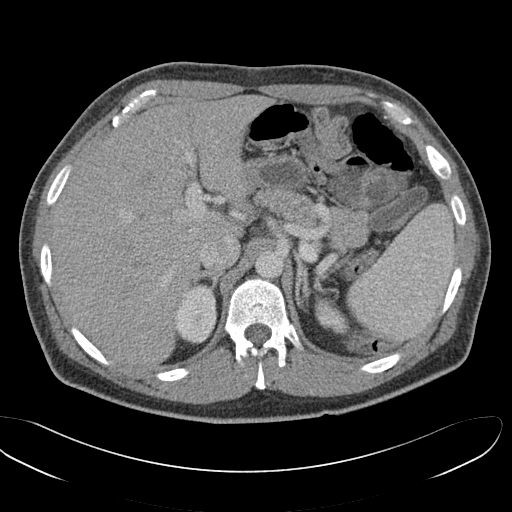
[im 119/172  lung]
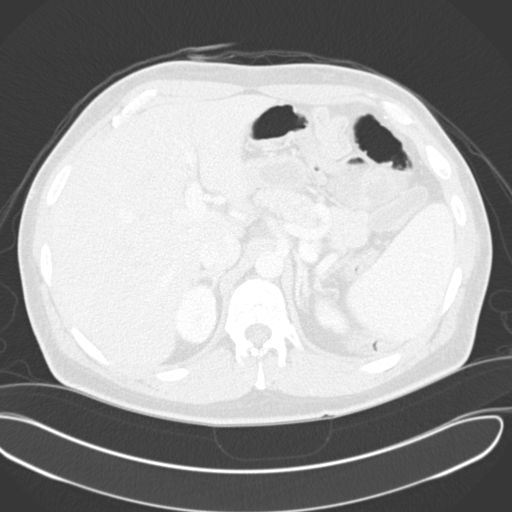
[im 119/172  bone]
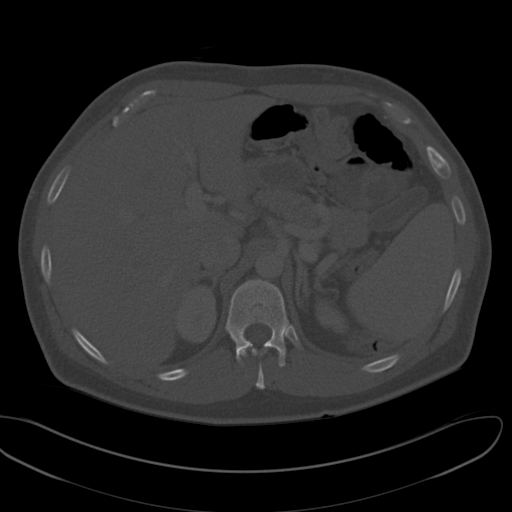
[im 132/172  soft-tissue]
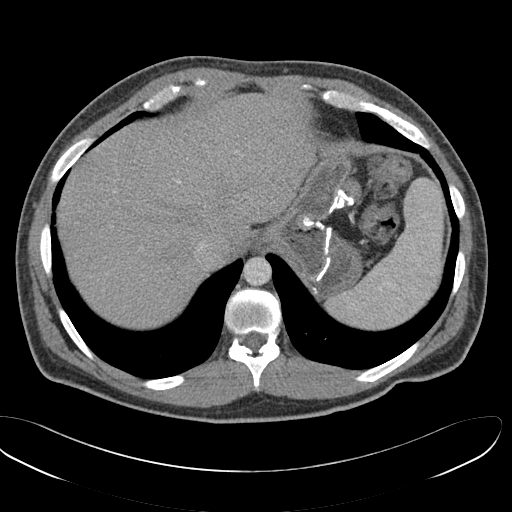
[im 132/172  lung]
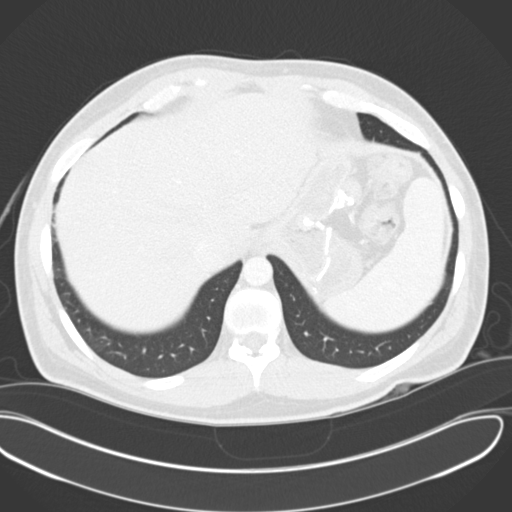
[im 145/172  soft-tissue]
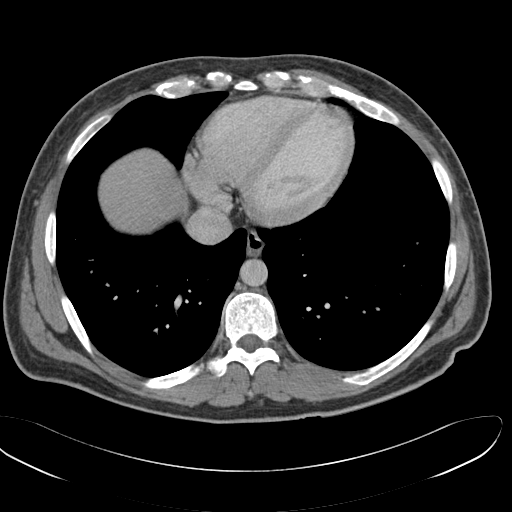
[im 145/172  lung]
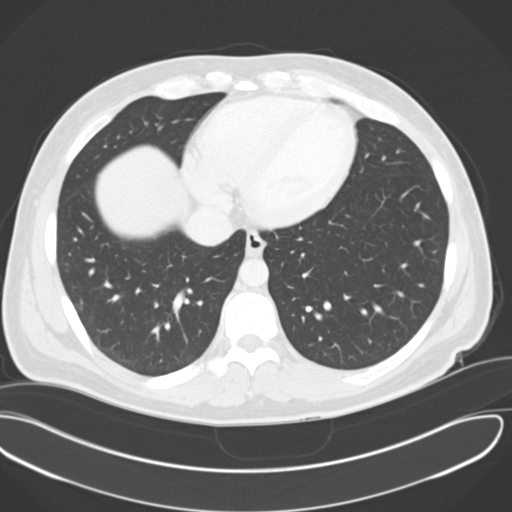
[im 158/172  soft-tissue]
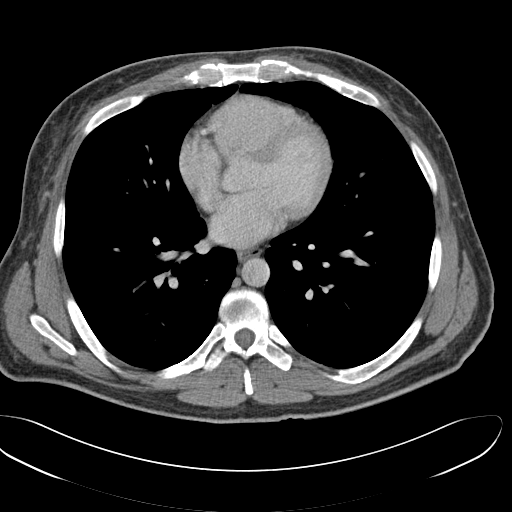
[im 158/172  lung]
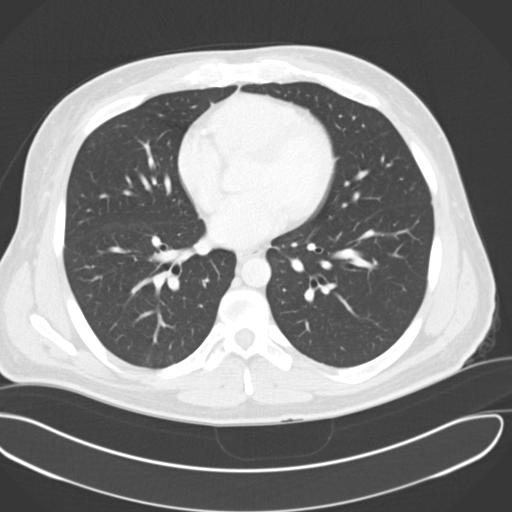

[12 of 32 positions shown; findings below may reference images not displayed]

FINDINGS: The lung bases are unremarkable.

The liver demonstrates a fine reticular nodular pattern. The spleen is
mildly enlarged at 13.82 cm. The adrenals and pancreas are unremarkable.
Nonobstructing calculi identified within the right and left kidneys. There
is no evidence of abdominal aortic aneurysm. The celiac, SMA, IMA portal
vein, SMV are opacified. Splenic varices are appreciated. There findings
consistent with prior gastric bypass procedure.

There is no evidence of bowel obstruction, enteritis colitis, diverticulitis
nor appendicitis if clinically appropriate.
IMPRESSION: Findings raising concern of portal venous hypertension.
2. No evidence of obstructive or inflammatory abnormalities
3. Findings concerning for cirrhotic changes within the liver.

## 2014-04-06 ENCOUNTER — Other Ambulatory Visit: Payer: Self-pay | Admitting: Pain Medicine

## 2014-04-06 DIAGNOSIS — M545 Low back pain: Secondary | ICD-10-CM

## 2014-05-04 ENCOUNTER — Ambulatory Visit
Admission: RE | Admit: 2014-05-04 | Discharge: 2014-05-04 | Disposition: A | Discharge status: Home / Self Care | Payer: BLUE CROSS/BLUE SHIELD | Source: Ambulatory Visit | Attending: Pain Medicine | Admitting: Pain Medicine

## 2014-05-04 DIAGNOSIS — M545 Low back pain: Secondary | ICD-10-CM

## 2014-05-09 ENCOUNTER — Other Ambulatory Visit: Payer: Self-pay

## 2014-05-26 NOTE — Consult Note (Signed)
PATIENT NAME:  Dave Reynolds, Dave Reynolds MR#:  [id number] DATE OF BIRTH:  1980-02-10  DATE OF CONSULTATION:  12/02/2011  REFERRING PHYSICIAN:   CONSULTING PHYSICIAN:  Tavarion Babington A. Egbert GaribaldiBird, MD  REASON FOR CONSULTATION: Severe abdominal pain and leukocytosis.   HISTORY OF PRESENT ILLNESS: 34 year old white male presented to the hospital with the acute onset of diffuse abdominal pain associated with nausea and some diarrhea occurring abruptly at approximately 4:30 in the morning. Upon arrival to the Emergency Room, the patient required a substantial amount of intravenous hydromorphone and fentanyl. His pain is better controlled. He was found to have an elevated white count of 23,000.  IV contrasted CT scan demonstrates no acute findings with possible changes in the liver consistent with changes of portal hypertension and a few gastrosplenic varices. There is no free fluid. There is no visualized appendix. The gallbladder appears to be normal. There is bilateral intrarenal nephrolithiasis. Ureters and bladder appear unremarkable. Retroperitoneum appears normal. In the coronal views of the CT scan I can appreciate the Roux limb, which appears to be unremarkable. There is no evidence of bowel dilatation.   In reviewing his history, he has had two other episodes of similar type but less intense abdominal pain resulting in two ER admissions, one in August and one in September, both in different cities.  By his mother's report, the patient was treated for a " bowel obstruction" with enemas and cathartics at one visit. He has [place] [facility] [place] [facility] [place] [facility] [place] [facility] problem, but has a scheduled appointment in the next several weeks as routine followup. The patient states that he has lost over 100 pounds since his surgery. No other abdominal operations.   ALLERGIES: Reglan.   MEDICATIONS: None.   PAST MEDICAL HISTORY: As described above.   PAST SURGICAL HISTORY:  1. Laparoscopic Roux-en-Y gastric bypass.   2. Ankle surgery in the past.   SOCIAL HISTORY: The patient does not smoke, does not drink.  He is accompanied by his girlfriend as well as his parents.   REVIEW OF SYSTEMS: As described above and otherwise unremarkable.   PHYSICAL EXAMINATION:  GENERAL: The patient is alert, groggy but cooperative.   VITAL SIGNS: Temperature 96, pulse 87, blood pressure is not recorded. Height 5 feet 8 inches. Weight 175 pounds. BMI 26.  ABDOMEN: Soft, minimally tender to deep palpation. No obvious hernias. No peritoneal signs.   EXTREMITIES: Warm and well perfused.   LUNGS: Clear.   HEART: Regular rate and rhythm.   NECK: Supple. No adenopathy.   LABS/STUDIES: Urinalysis: 38 RBCs per high-power field, 16 WBCs per high-power field, 3+ blood, lipase 96, total bilirubin 1.5, glucose 121, WBC 23, hemoglobin 16, platelet count 200,000.   IMPRESSION: Abdominal pain which does not appear to be surgical in etiology. Given the urinalysis certainly the possibility of a passed kidney stone into the bladder is a distinct possibility. Certainly he could be having some complication related to his previous Roux-en-Y gastric bypass with intermittent volvulus of intestinal segment; however, the absence of CT scan findings at this point makes that less of a likelihood.    PLAN: The patient will be admitted, started on intravenous antibiotics, intravenous fluids, and we will strain his urine.  I anticipate that this pain will resolve and the patient can follow up as an outpatient with [facility] gastric bypass surgeon [facility] as [facility].  ____________________________ Redge GainerMark A. Egbert GaribaldiBird, MD [id number] D:  12/02/2011 11:28:40 ET  T: 12/02/2011 13:36:15 ET         JOB#: [id number] Cierria Height A Han Lysne MD ELECTRONICALLY SIGNED 12/04/2011 19:19

## 2014-05-26 NOTE — Consult Note (Signed)
Brief Consult Note: Diagnosis: abdominal pain severe, unclear etiology.   Patient was seen by consultant.   Consult note dictated.   Recommend further assessment or treatment.   Orders entered.   Discussed with Attending MD.   Comments: Wonder if abdominal pain is related to previous RNYGB.  Electronic Signatures: Natale LayBird, Favour Aleshire (MD)  (Signed 26-Oct-13 11:21)  Authored: Brief Consult Note   Last Updated: 26-Oct-13 11:21 by Natale LayBird, Tahni Porchia (MD)

## 2018-12-02 ENCOUNTER — Other Ambulatory Visit: Payer: Self-pay | Admitting: Pain Medicine

## 2018-12-02 DIAGNOSIS — M545 Low back pain, unspecified: Secondary | ICD-10-CM

## 2018-12-26 ENCOUNTER — Ambulatory Visit
Admission: RE | Admit: 2018-12-26 | Discharge: 2018-12-26 | Disposition: A | Discharge status: Home / Self Care | Payer: BC Managed Care – PPO | Source: Ambulatory Visit | Attending: Pain Medicine | Admitting: Pain Medicine

## 2018-12-26 ENCOUNTER — Other Ambulatory Visit: Payer: Self-pay

## 2018-12-26 DIAGNOSIS — M545 Low back pain, unspecified: Secondary | ICD-10-CM

## 2021-08-20 ENCOUNTER — Ambulatory Visit
Admission: EM | Admit: 2021-08-20 | Discharge: 2021-08-20 | Discharge status: Absent without leave | Payer: BC Managed Care – PPO
